# Patient Record
Sex: Female | Born: 1990 | Marital: Single | Smoking: Never smoker
Health system: Southern US, Community
[De-identification: ages and names within clinical notes are randomized; demographics above are authoritative.]

## PROBLEM LIST (undated history)

## (undated) DIAGNOSIS — T7840XA Allergy, unspecified, initial encounter: Secondary | ICD-10-CM

## (undated) DIAGNOSIS — F419 Anxiety disorder, unspecified: Secondary | ICD-10-CM

## (undated) HISTORY — DX: Anxiety disorder, unspecified: F41.9

## (undated) HISTORY — PX: ANTERIOR CRUCIATE LIGAMENT REPAIR: SHX115

## (undated) HISTORY — DX: Allergy, unspecified, initial encounter: T78.40XA

---

## 2011-08-31 ENCOUNTER — Other Ambulatory Visit: Payer: Self-pay | Admitting: Sports Medicine

## 2011-08-31 ENCOUNTER — Ambulatory Visit
Admission: RE | Admit: 2011-08-31 | Discharge: 2011-08-31 | Disposition: A | Discharge status: Home / Self Care | Payer: No Typology Code available for payment source | Source: Ambulatory Visit | Attending: Sports Medicine | Admitting: Sports Medicine

## 2011-08-31 DIAGNOSIS — M25561 Pain in right knee: Secondary | ICD-10-CM

## 2013-05-01 ENCOUNTER — Encounter (HOSPITAL_COMMUNITY): Payer: Self-pay | Admitting: Emergency Medicine

## 2013-05-01 ENCOUNTER — Emergency Department (HOSPITAL_COMMUNITY)
Admission: EM | Admit: 2013-05-01 | Discharge: 2013-05-01 | Disposition: A | Discharge status: Home / Self Care | Payer: Managed Care, Other (non HMO) | Attending: Emergency Medicine | Admitting: Emergency Medicine

## 2013-05-01 DIAGNOSIS — R590 Localized enlarged lymph nodes: Secondary | ICD-10-CM

## 2013-05-01 DIAGNOSIS — M549 Dorsalgia, unspecified: Secondary | ICD-10-CM | POA: Insufficient documentation

## 2013-05-01 DIAGNOSIS — M25569 Pain in unspecified knee: Secondary | ICD-10-CM | POA: Insufficient documentation

## 2013-05-01 DIAGNOSIS — R599 Enlarged lymph nodes, unspecified: Secondary | ICD-10-CM | POA: Insufficient documentation

## 2013-05-01 LAB — WET PREP, GENITAL
TRICH WET PREP: NONE SEEN
YEAST WET PREP: NONE SEEN

## 2013-05-01 MED ORDER — CEPHALEXIN 500 MG PO CAPS: 1000.0000 mg | ORAL_CAPSULE | Freq: Two times a day (BID) | ORAL | Status: DC

## 2013-05-01 NOTE — ED Provider Notes (Signed)
CSN: [id number]     Arrival date & time 05/01/13  1914 History  This chart was scribed for non-physician practitioner, Otilio Miuatherine E. Latrell Potempa, PA-C, working with Celene KrasJon R Knapp, MD, by Ellin MayhewMichael Levi, ED Scribe. This patient was seen in room WTR8/WTR8 and the patient's care was started at 8:23 PM.  The history is provided by the patient and a friend. No language interpreter was used.   HPI Comments: Misty Jackson is a 23 y.o. female who presents to the Emergency Department with a chief complaint of L groin swelling with onset 1 month. Patient reports she was seen at school by the PA and was informed she had a swollen lymph node. Patient was encouraged to take ibuprofen, and she has, with no relief. She reports being seen at the UC after seeing the PA at school, and having a UA, PAP smear, and pelvic exam for STDs with negative findings one month ago. Patient states that she has had constant, progressively worsening pain. She characterizes the pain as a mild ache which intermittently radiates as a tingling sensation to the back of the L calf and low back. She denies any trauma to the area, any other bumps, fevers, chills, nausea, vomiting, dysuria, vaginal discharge, abdominal pain, abnormal BMs. She denies any drainage from the area. She reports recently having a tattoo to the L buttocks approximately one month ago. She reports that she is sexually active with females and does not use protection. Patient is an otherwise healthy female. Patient states she is originally from [place] and does not have a PCP in [place].    Patient states she does not have an OB/GYN.   No past medical history on file. No past surgical history on file. No family history on file. History  Substance Use Topics  . Smoking status: Not on file  . Smokeless tobacco: Not on file  . Alcohol Use: Not on file   OB History   No data available     Review of Systems  Constitutional: Negative for fever, chills, activity change  and appetite change.  Gastrointestinal: Negative for nausea, vomiting, abdominal pain and diarrhea.  Genitourinary: Negative for dysuria, vaginal discharge, difficulty urinating and vaginal pain.  Musculoskeletal: Positive for arthralgias and back pain.  Neurological: Negative for numbness.  Hematological: Positive for adenopathy.  All other systems reviewed and are negative.  Allergies  Review of patient's allergies indicates not on file.  Home Medications   Prior to Admission medications   Not on File   Triage Vitals: BP 101/86  Pulse 67  Temp(Src) 98.7 F (37.1 C) (Oral)  Resp 17  SpO2 100%  Physical Exam  Nursing note and vitals reviewed. Constitutional: She is oriented to person, place, and time. She appears well-developed and well-nourished. No distress.  HENT:  Head: Normocephalic and atraumatic.  Eyes:  Normal appearance  Neck: Normal range of motion.  Pulmonary/Chest: Effort normal.  Genitourinary:  No genitalia rash.  No vaginal discharge/bleeding.  Cervix closed and appears nml.  No adnexal or cervical motion tenderess.  1-2 tender L inguinal lymph nodes.  Only one is mildly enlarged when compared to R side.  No overlying skin changes.  There is a large tattoo on L buttock w/out erythema, rash, tenderness.  No cervical, supraclavicular, axillary or R inguinal adenopathy.  Musculoskeletal: Normal range of motion.  Neurological: She is alert and oriented to person, place, and time.  Psychiatric: She has a normal mood and affect. Her behavior is normal.  ED Course  Procedures (including critical care time)  COORDINATION OF CARE: 8:33 PM-Discussed the possibility of an infectious process in the area. Recommended a pelvic exam to r/o any infection. Recommended patient to f/u with a gynecologist. Treatment plan discussed with patient and patient agrees.  Labs Review Labs Reviewed - No data to display  Imaging Review No results found.   EKG  Interpretation None      MDM   Final diagnoses:  Inguinal adenopathy    22yo healthy F presents w/ >1 month tender inguinal adenopathy.  No associated sx.   Large tattoo on L buttock that she got just prior to onset but it has been asymptomatic.  Has [facility] as well as an urgent care since onset, urine tested and neg for STDs and advised to take NSAID for pain.  On exam, 1-2 L inguinal lymph nodes mildly enlarged compared to R and ttp.  No other palpable lymph nodes.  Tattoo w/out erythema or other overlying skin changes.  Unremarkable genitalia.  Wet prep normal.  D/c'd home w/ 10d course of keflex and [facility].   I personally performed the services described in this documentation, which was scribed in my presence. The recorded information has been reviewed and is accurate.    Otilio MiuCatherine E Dickson Kostelnik, PA-C 05/01/13 2207

## 2013-05-01 NOTE — Discharge Instructions (Signed)
Your vaginal swab was normal.  The STD tests will result in 3 days and we will contact you if by chance they come back positive.  Take antibiotic as prescribed for possible bacterial infection.  Take tylenol or ibuprofen as needed for pain.  Follow [facility].   You may return to the ER if symptoms worsen or you have any other concerns.   Lymphadenopathy Lymphadenopathy means "disease of the lymph glands." But the term is usually used to describe swollen or enlarged lymph glands, also called lymph nodes. These are the bean-shaped organs found in many locations including the neck, underarm, and groin. Lymph glands are part of the immune system, which fights infections in your body. Lymphadenopathy can occur in just one area of the body, such as the neck, or it can be generalized, with lymph node enlargement in several areas. The nodes found in the neck are the most common sites of lymphadenopathy. CAUSES  When your immune system responds to germs (such as viruses or bacteria ), infection-fighting cells and fluid build up. This causes the glands to grow in size. This is usually not something to worry about. Sometimes, the glands themselves can become infected and inflamed. This is called lymphadenitis. Enlarged lymph nodes can be caused by many diseases:  Bacterial disease, such as strep throat or a skin infection.  Viral disease, such as a common cold.  Other germs, such as lyme disease, tuberculosis, or sexually transmitted diseases.  Cancers, such as lymphoma (cancer of the lymphatic system) or leukemia (cancer of the white blood cells).  Inflammatory diseases such as lupus or rheumatoid arthritis.  Reactions to medications. Many of the diseases above are rare, but important. This is why you should see your caregiver if you have lymphadenopathy. SYMPTOMS   Swollen, enlarged lumps in the neck, back of the head or other locations.  Tenderness.  Warmth or redness of the  skin over the lymph nodes.  Fever. DIAGNOSIS  Enlarged lymph nodes are often near the source of infection. They can help healthcare providers diagnose your illness. For instance:   Swollen lymph nodes around the jaw might be caused by an infection in the mouth.  Enlarged glands in the neck often signal a throat infection.  Lymph nodes that are swollen in more than one area often indicate an illness caused by a virus. Your caregiver most likely will know what is causing your lymphadenopathy after listening to your history and examining you. Blood tests, x-rays or other tests may be needed. If the cause of the enlarged lymph node cannot be found, and it does not go away by itself, then a biopsy may be needed. Your caregiver will discuss this with you. TREATMENT  Treatment for your enlarged lymph nodes will depend on the cause. Many times the nodes will shrink to normal size by themselves, with no treatment. Antibiotics or other medicines may be needed for infection. Only take over-the-counter or prescription medicines for pain, discomfort or fever as directed by your caregiver. HOME CARE INSTRUCTIONS  Swollen lymph glands usually return to normal when the underlying medical condition goes away. If they persist, contact your health-care provider. He/she might prescribe antibiotics or other treatments, depending on the diagnosis. Take any medications exactly as prescribed. Keep any follow-up appointments made to check on the condition of your enlarged nodes.  SEEK MEDICAL CARE IF:   Swelling lasts for more than two weeks.  You have symptoms such as weight loss, night sweats, fatigue  or fever that does not go away.  The lymph nodes are hard, seem fixed to the skin or are growing rapidly.  Skin over the lymph nodes is red and inflamed. This could mean there is an infection. SEEK IMMEDIATE MEDICAL CARE IF:   Fluid starts leaking from the area of the enlarged lymph node.  You develop a fever of  102 F (38.9 C) or greater.  Severe pain develops (not necessarily at the site of a large lymph node).  You develop chest pain or shortness of breath.  You develop worsening abdominal pain. MAKE SURE YOU:   Understand these instructions.  Will watch your condition.  Will get help right away if you are not doing well or get worse. Document Released: 09/29/2007 Document Revised: 03/14/2011 Document Reviewed: 09/29/2007 [facility] Patient Information 2014 [employer] [place].

## 2013-05-01 NOTE — ED Notes (Signed)
Bed: WA02 Expected date:  Expected time:  Means of arrival:  Comments: Tri 8

## 2013-05-01 NOTE — ED Notes (Signed)
Pt c/o L groin swelling. Pt went to UC and was told she has a swollen lymph node. Pt states it has been there for about a month. Pt denies any drainage from area.

## 2013-05-02 LAB — GC/CHLAMYDIA PROBE AMP
CT Probe RNA: NEGATIVE
GC Probe RNA: NEGATIVE

## 2013-05-03 NOTE — ED Provider Notes (Signed)
Medical screening examination/treatment/procedure(s) were performed by non-physician practitioner and as supervising physician I was immediately available for consultation/collaboration.    Aubryn Spinola R Theresea Trautmann, MD 05/03/13 1536 

## 2014-02-04 ENCOUNTER — Other Ambulatory Visit: Payer: Self-pay | Admitting: Cardiology

## 2014-02-04 ENCOUNTER — Ambulatory Visit
Admission: RE | Admit: 2014-02-04 | Discharge: 2014-02-04 | Disposition: A | Discharge status: Home / Self Care | Payer: No Typology Code available for payment source | Source: Ambulatory Visit | Attending: Cardiology | Admitting: Cardiology

## 2014-02-04 DIAGNOSIS — R0602 Shortness of breath: Secondary | ICD-10-CM

## 2016-08-12 IMAGING — CR DG CHEST 2V
2 series · 2 of 2 positions shown · non-contrast
Comparison: None.

CLINICAL DATA: Intermittent Cardiac palpitations, shortness of
breath, upper chest pain, and dry cough.

EXAM:
CHEST  2 VIEW

[w chest pa]
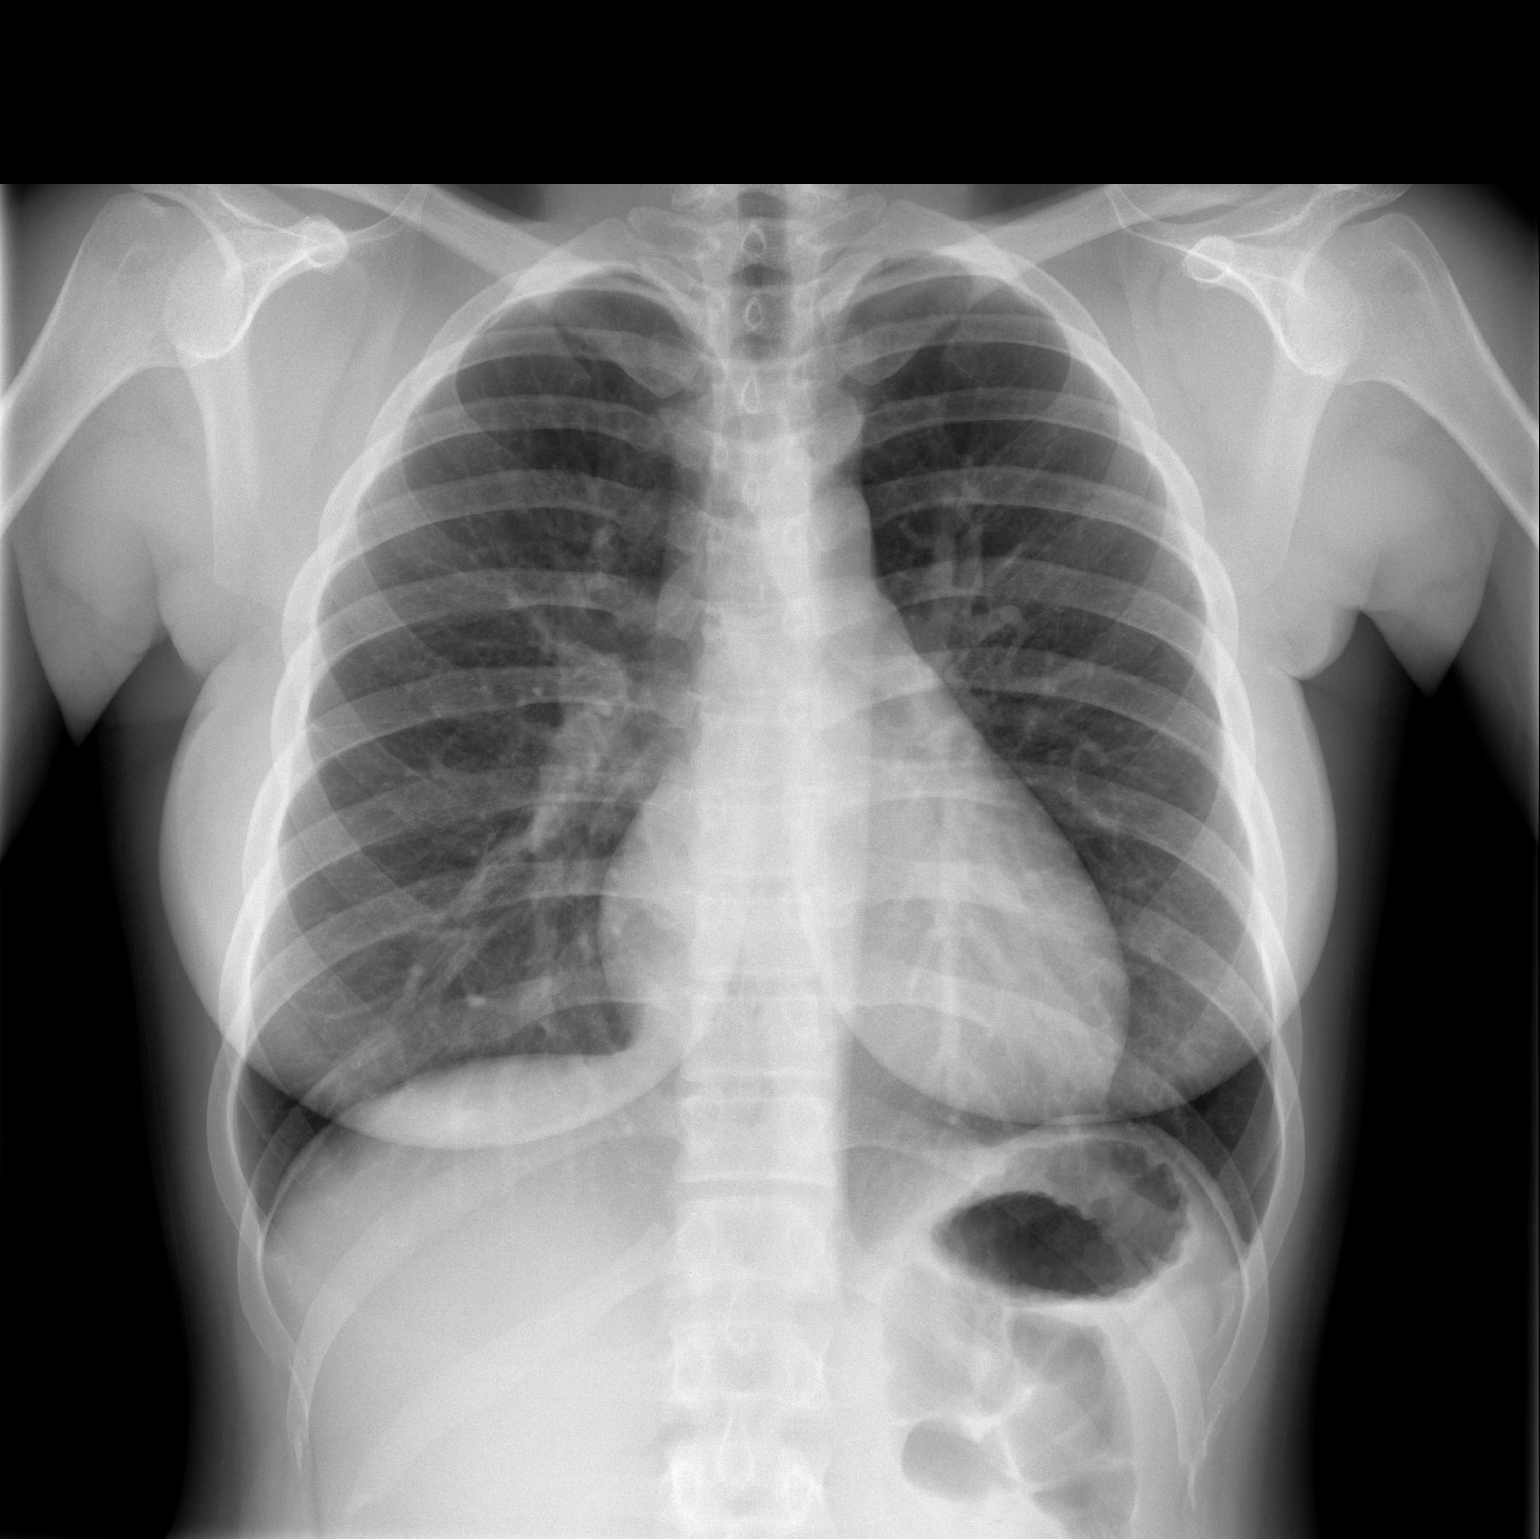

[w chest lat]
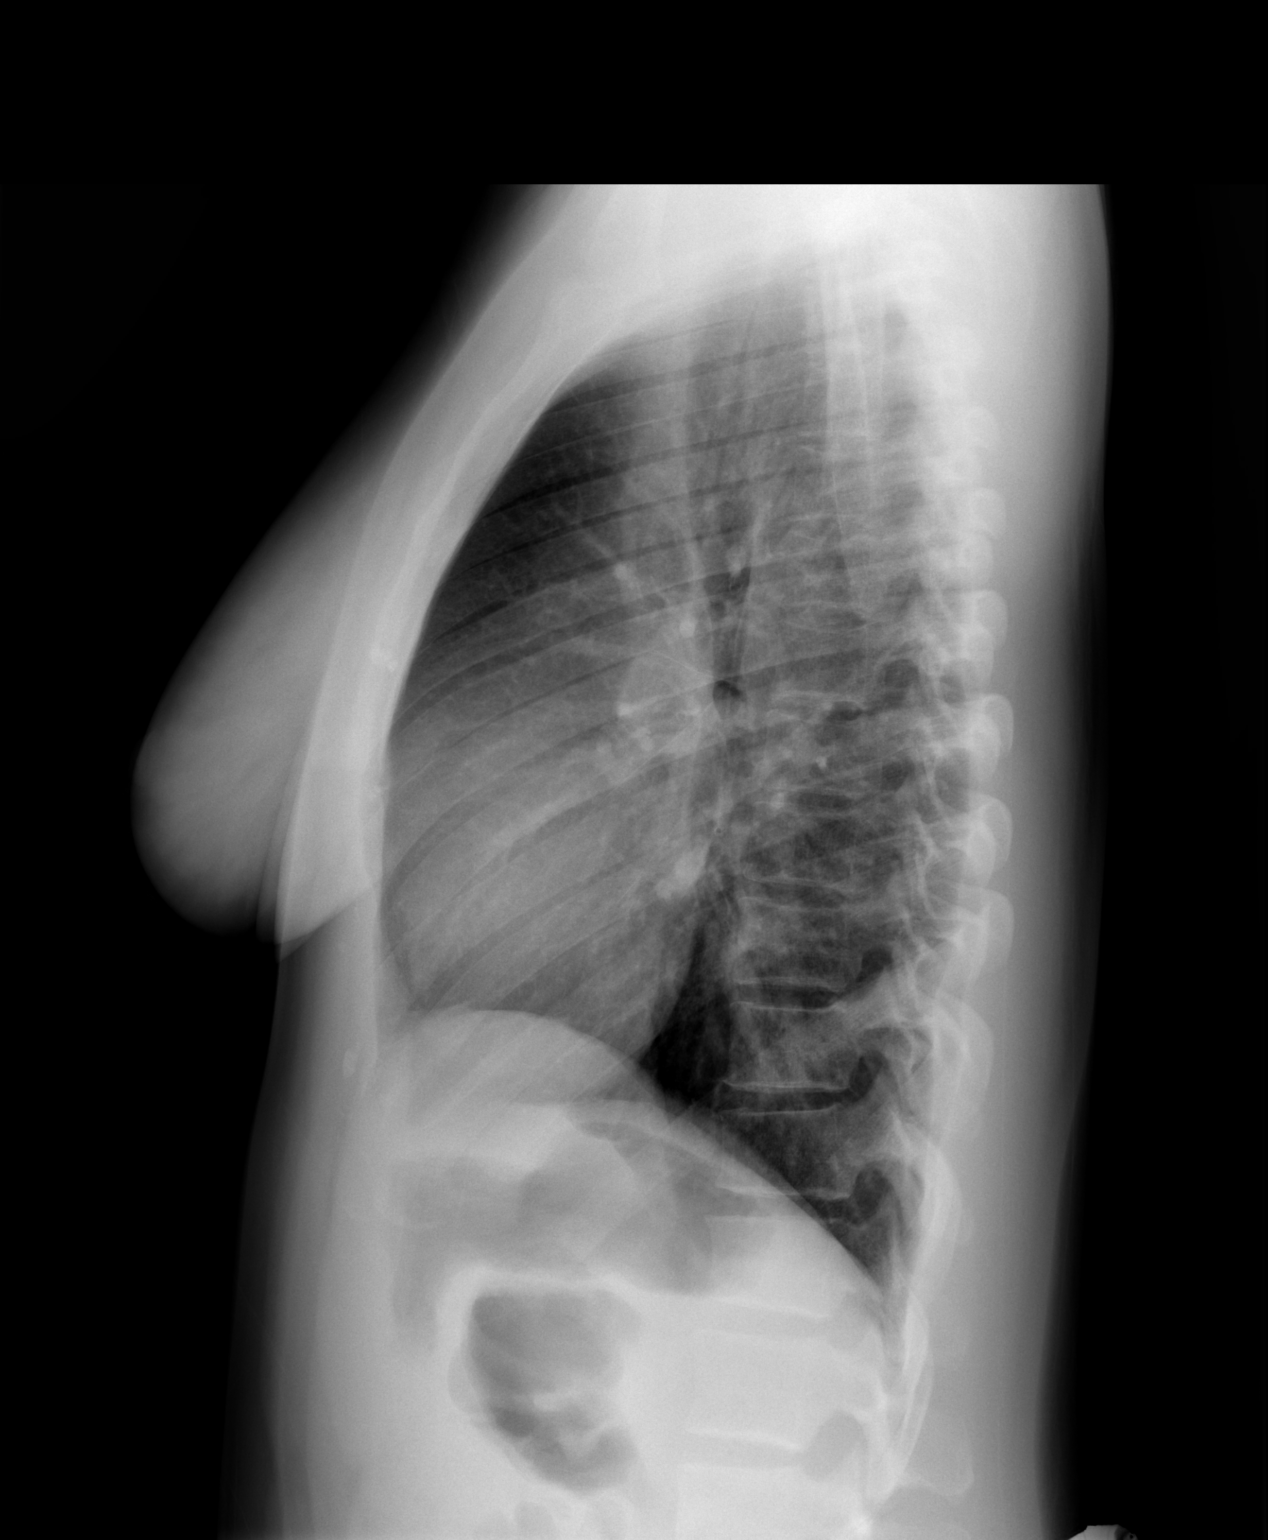

[2 of 2 positions shown; findings below may reference images not displayed]

FINDINGS: Borderline enlargement of the cardiopericardial silhouette the lungs
appear clear. No retrosternal density or pleural effusion.
IMPRESSION: 1. Borderline enlargement of the cardiopericardial silhouette.
Otherwise negative.

## 2016-11-10 ENCOUNTER — Encounter: Payer: Self-pay | Admitting: Family Medicine

## 2016-11-10 ENCOUNTER — Ambulatory Visit: Payer: BC Managed Care – PPO | Admitting: Family Medicine

## 2016-11-10 ENCOUNTER — Other Ambulatory Visit: Payer: Self-pay

## 2016-11-10 VITALS — BP 108/70 | HR 69 | Temp 98.1°F | Resp 18 | Ht 65.2 in | Wt 165.6 lb

## 2016-11-10 DIAGNOSIS — N760 Acute vaginitis: Secondary | ICD-10-CM | POA: Diagnosis not present

## 2016-11-10 DIAGNOSIS — R197 Diarrhea, unspecified: Secondary | ICD-10-CM

## 2016-11-10 DIAGNOSIS — R1030 Lower abdominal pain, unspecified: Secondary | ICD-10-CM | POA: Diagnosis not present

## 2016-11-10 DIAGNOSIS — B9689 Other specified bacterial agents as the cause of diseases classified elsewhere: Secondary | ICD-10-CM | POA: Diagnosis not present

## 2016-11-10 DIAGNOSIS — Z124 Encounter for screening for malignant neoplasm of cervix: Secondary | ICD-10-CM | POA: Diagnosis not present

## 2016-11-10 LAB — POCT URINALYSIS DIP (MANUAL ENTRY)
Bilirubin, UA: NEGATIVE
Blood, UA: NEGATIVE
Glucose, UA: NEGATIVE mg/dL
Ketones, POC UA: NEGATIVE mg/dL
Leukocytes, UA: NEGATIVE
Nitrite, UA: NEGATIVE
Protein Ur, POC: NEGATIVE mg/dL
Spec Grav, UA: 1.02 (ref 1.010–1.025)
Urobilinogen, UA: 0.2 E.U./dL
pH, UA: 7 (ref 5.0–8.0)

## 2016-11-10 LAB — POCT WET + KOH PREP
Trich by wet prep: ABSENT
Yeast by KOH: ABSENT
Yeast by wet prep: ABSENT

## 2016-11-10 MED ORDER — METRONIDAZOLE 500 MG PO TABS: 500.0000 mg | ORAL_TABLET | Freq: Two times a day (BID) | ORAL | 0 refills | Status: DC

## 2016-11-10 NOTE — Progress Notes (Signed)
104 1 

## 2016-11-10 NOTE — Progress Notes (Addendum)
11/8/20188:45 AM  Misty DiceAshley Scheidegger 1990/06/14, 26 y.o. female [id number]  Chief Complaint  Patient presents with  . Abdominal Pain    X 2-3 mth off and on abdominal cramps  . Pap Smear    HPI:   Patient is a 26 y.o. female who presents today for 2-3 months of intermittent abdominal cramping with bloating. She thinks it is happening about 2 weeks before her period for 2-3 days. However she has also noticed diarrhea for the past 2 weeks. Last week her bad pain woke her up from sleep. She took ibuprofen and was able to go back to sleep. She denies any nausea, vomiting, constipation, fever, chills, decreased appetite, malaise, dysuria, hematuria, abnormal vaginal discharge. 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  G0 LMP 10/14/16 Menses are regular of normal flow Sexually active, prefers women, last encounter with men was in June, used condoms OCPs in high school, did not like side effects of bloating and weight gain No recent STD testing Reports due for pap, denies h/o abnormal  Depression screen PHQ 2/9 11/10/2016  Decreased Interest 0  Down, Depressed, Hopeless 0  PHQ - 2 Score 0    No Known Allergies  Prior to Admission medications   Medication Sig Start Date End Date Taking? Authorizing Provider  acetaminophen (TYLENOL) 325 MG tablet Take 650 mg by mouth every 6 (six) hours as needed (pain).   Yes [provider]  Multiple Vitamins-Minerals (HAIR/SKIN/NAILS) TABS Take 1 tablet by mouth daily.   Yes [provider]  cephALEXin (KEFLEX) 500 MG capsule Take 2 capsules (1,000 mg total) by mouth 2 (two) times daily. Patient not taking: Reported on 11/10/2016 05/01/13   Schinlever, Santina Evansatherine, PA-C  cetirizine (ZYRTEC) 10 MG tablet Take 10 mg by mouth daily.    [provider]    Past Medical History:  Diagnosis Date  . Allergy   . Anxiety     Past Surgical History:  Procedure Laterality Date  . ANTERIOR CRUCIATE LIGAMENT  REPAIR Right    2010 and 2013    Social History   Tobacco Use  . Smoking status: Never Smoker  . Smokeless tobacco: Never Used  Substance Use Topics  . Alcohol use: Yes    Comment: occ    Family History  Problem Relation Age of Onset  . Cancer Mother   . Stroke Maternal Grandmother   . Stroke Paternal Grandmother   . Stroke Paternal Grandfather     ROS Per hpi  OBJECTIVE:  Blood pressure 108/70, pulse 69, temperature 98.1 F (36.7 C), temperature source Oral, resp. rate 18, height 5' 5.2" (1.656 m), weight 165 lb 9.6 oz (75.1 kg), last menstrual period 10/14/2016, SpO2 99 %.  Physical Exam  Constitutional: She is oriented to person, place, and time and well-developed, well-nourished, and in no distress.  HENT:  Head: Normocephalic and atraumatic.  Mouth/Throat: Oropharynx is clear and moist. No oropharyngeal exudate.  Eyes: EOM are normal. Pupils are equal, round, and reactive to light. No scleral icterus.  Neck: Neck supple.  Cardiovascular: Normal rate, regular rhythm and normal heart sounds. Exam reveals no gallop and no friction rub.  No murmur heard. Pulmonary/Chest: Effort normal and breath sounds normal. She has no wheezes. She has no rales.  Abdominal: Soft. Bowel sounds are normal. There is tenderness in the suprapubic area, left upper quadrant and left lower quadrant. There is no rebound and no guarding.  Genitourinary: Uterus is not enlarged and  not tender.  Cervix is not fixed. Cervix exhibits no motion tenderness, no lesion and no tenderness. Right adnexum displays no mass and no tenderness. Left adnexum displays no mass and no tenderness. Vulva exhibits no lesion and no rash. Vagina exhibits rugosity. Vagina exhibits no lesion. Thin  white and vaginal discharge found.  Musculoskeletal: She exhibits no edema.  Neurological: She is alert and oriented to person, place, and time. Gait normal.  Skin: Skin is warm and dry.      Results for orders placed or  performed in visit on 11/10/16 (from the past 24 hour(s))  POCT Wet + KOH Prep     Status: Abnormal   Collection Time: 11/10/16  9:05 AM  Result Value Ref Range   Yeast by KOH Absent Absent   Yeast by wet prep Absent Absent   WBC by wet prep None (A) Few   Clue Cells Wet Prep HPF POC Moderate (A) None   Trich by wet prep Absent Absent   Bacteria Wet Prep HPF POC Many (A) Few   Epithelial Cells By [employer] Financial[employer]et Pref ([facility]) Few None, Few, Too numerous to count   RBC,UR,HPF,POC None None RBC/hpf    No results found.   ASSESSMENT and PLAN  1. Encounter for screening for cervical cancer  - Pap IG, CT/NG w/ reflex HPV when ASC-U  2. Lower abdominal pain Might be related to Bacterial vaginosis, abx prescribed, r/se/b discussed. Also discussed it might be ovulation related given pattern. - POCT Wet + KOH Prep - POCT urinalysis dipstick  3. Diarrhea, unspecified type - GI Profile, Stool, PCR  4. Bacterial vaginosis - metroNIDAZOLE (FLAGYL) 500 MG tablet; Take 1 tablet (500 mg total) 2 (two) times daily by mouth.   Return if symptoms worsen or fail to improve.    Myles LippsIrma M Santiago, MD [facility] at [facility] [address] [place], [place] [address] Ph.  [phone] Fax [phone]

## 2016-11-10 NOTE — Patient Instructions (Signed)
     IF you received an x-ray today, you will receive an invoice from [facility]. Please contact [facility] at [phone] with questions or concerns regarding your invoice.   IF you received labwork today, you will receive an invoice from [facility]. Please contact [facility] at [phone] with questions or concerns regarding your invoice.   Our billing staff will not be able to assist you with questions regarding bills from these companies.  You will be contacted with the lab results as soon as they are available. The fastest way to get your results is to activate your My Chart account. Instructions are located on the last page of this paperwork. If you have not heard from us regarding the results in 2 weeks, please contact this office.     

## 2016-11-14 LAB — PAP IG, CT-NG, RFX HPV ASCU
Chlamydia, Nuc. Acid Amp: NEGATIVE
Gonococcus by Nucleic Acid Amp: NEGATIVE
PAP Smear Comment: 0

## 2016-11-15 ENCOUNTER — Encounter: Payer: Self-pay | Admitting: Family Medicine

## 2017-12-19 ENCOUNTER — Other Ambulatory Visit: Payer: Self-pay

## 2017-12-19 ENCOUNTER — Ambulatory Visit: Payer: BC Managed Care – PPO | Admitting: Family Medicine

## 2017-12-19 ENCOUNTER — Encounter: Payer: Self-pay | Admitting: Family Medicine

## 2017-12-19 VITALS — BP 106/69 | HR 60 | Temp 98.6°F | Ht 65.2 in | Wt 161.2 lb

## 2017-12-19 DIAGNOSIS — Z0000 Encounter for general adult medical examination without abnormal findings: Secondary | ICD-10-CM

## 2017-12-19 DIAGNOSIS — F418 Other specified anxiety disorders: Secondary | ICD-10-CM

## 2017-12-19 DIAGNOSIS — Z1321 Encounter for screening for nutritional disorder: Secondary | ICD-10-CM

## 2017-12-19 DIAGNOSIS — Z13220 Encounter for screening for lipoid disorders: Secondary | ICD-10-CM

## 2017-12-19 DIAGNOSIS — Z113 Encounter for screening for infections with a predominantly sexual mode of transmission: Secondary | ICD-10-CM

## 2017-12-19 DIAGNOSIS — Z13228 Encounter for screening for other metabolic disorders: Secondary | ICD-10-CM

## 2017-12-19 DIAGNOSIS — Z0001 Encounter for general adult medical examination with abnormal findings: Secondary | ICD-10-CM | POA: Diagnosis not present

## 2017-12-19 DIAGNOSIS — Z1329 Encounter for screening for other suspected endocrine disorder: Secondary | ICD-10-CM

## 2017-12-19 DIAGNOSIS — Z130 Encounter for screening for diseases of the blood and blood-forming organs and certain disorders involving the immune mechanism: Secondary | ICD-10-CM

## 2017-12-19 MED ORDER — DULOXETINE HCL 30 MG PO CPEP: 30.0000 mg | ORAL_CAPSULE | Freq: Every day | ORAL | 3 refills | Status: DC

## 2017-12-19 MED ORDER — MONTELUKAST SODIUM 10 MG PO TABS: 10.0000 mg | ORAL_TABLET | Freq: Every day | ORAL | 3 refills | Status: DC

## 2017-12-19 NOTE — Progress Notes (Signed)
12/17/20193:54 PM  Misty Jackson 1990/04/18, 27 y.o. female [id number]  Chief Complaint  Patient presents with  . Annual Exam    needs allergy meds, using otc meds    HPI:   Patient is a 27 y.o. female with past medical history significant for seasonal allergies who presents today for CPE  Last CPE a year ago Last pap in 2018, normal Last STD testing a year ago GO Menses are regular Uses condoms Recently no sex drive  Broke her left foot metatarsal, currently in boot and scooter Was working out 2-3 x day Does not restrict eating Does endorse fear of "getting fat"  Having issues with allergies  Has tried zyrtec and allegra Does not want to use nasal spray  Having increased depression, worse before periods Has been on medication for anxiety before, and did not like them Last medications were about 3 years ago Has seen therapist before  Thinks she has been on lexapro and zoloft - may have been too stimulating ? buspar Having mood changes, can wake up fine and suddenly has change to dark mood, brain constantly moving, fleeting thoughts, some worrying Has occasional insomnia    Fall Risk  12/19/2017 11/10/2016  Falls in the past year? 1 No  Number falls in past yr: 0 -  Injury with Fall? 1 -     Depression screen [facility] 2/9 12/19/2017 12/19/2017 11/10/2016  Decreased Interest 3 0 0  Down, Depressed, Hopeless 2 0 0  PHQ - 2 Score 5 0 0  Altered sleeping 1 - -  Tired, decreased energy 1 - -  Change in appetite 2 - -  Feeling bad or failure about yourself  2 - -  Trouble concentrating 1 - -  Moving slowly or fidgety/restless 0 - -  Suicidal thoughts 1 - -  PHQ-9 Score 13 - -  Difficult doing work/chores Somewhat difficult - -   Reports passive SI, denies any thoughts of hurting herself  GAD 7 : Generalized Anxiety Score 12/19/2017  Nervous, Anxious, on Edge 2  Control/stop worrying 3  Worry too much - different things 1  Trouble relaxing 1  Restless 0    Easily annoyed or irritable 2  Afraid - awful might happen 2  Total GAD 7 Score 11  Anxiety Difficulty Somewhat difficult   MDQ9 = Q1. 8 positive, Q2 no, Q3, minor problem = negative screen  No Known Allergies  Prior to Admission medications   Medication Sig Start Date End Date Taking? Authorizing Provider  Multiple Vitamin (MULTIVITAMIN WITH MINERALS) TABS tablet Take 1 tablet by mouth daily.   Yes [provider]  Multiple Vitamins-Minerals (HAIR/SKIN/NAILS) TABS Take 1 tablet by mouth daily.   Yes [provider]    Past Medical History:  Diagnosis Date  . Allergy   . Anxiety     Past Surgical History:  Procedure Laterality Date  . ANTERIOR CRUCIATE LIGAMENT REPAIR Right    2010 and 2013    Social History   Tobacco Use  . Smoking status: Never Smoker  . Smokeless tobacco: Never Used  Substance Use Topics  . Alcohol use: Yes    Comment: occ    Family History  Problem Relation Age of Onset  . Cancer Mother   . Stroke Maternal Grandmother   . Stroke Paternal Grandmother   . Stroke Paternal Grandfather     Review of Systems  Constitutional: Negative for chills and fever.  Respiratory: Negative for cough and shortness of breath.  Cardiovascular: Negative for chest pain, palpitations and leg swelling.  Gastrointestinal: Negative for abdominal pain, nausea and vomiting.  All other systems reviewed and are negative. per hpi   OBJECTIVE:  Blood pressure 106/69, pulse 60, temperature 98.6 F (37 C), temperature source Oral, height 5' 5.2" (1.656 m), weight 161 lb 3.2 oz (73.1 kg), SpO2 98 %. Body mass index is 26.66 kg/m.   Physical Exam Vitals signs and nursing note reviewed.  Constitutional:      Appearance: She is well-developed.  HENT:     Head: Normocephalic and atraumatic.  Eyes:     General: No scleral icterus.    Conjunctiva/sclera: Conjunctivae normal.     Pupils: Pupils are equal, round, and reactive to light.  Neck:      Musculoskeletal: Neck supple.  Pulmonary:     Effort: Pulmonary effort is normal.  Skin:    General: Skin is warm and dry.  Neurological:     Mental Status: She is alert and oriented to person, place, and time.    ASSESSMENT and PLAN  1. Annual physical exam Routine HCM labs ordered. HCM reviewed/discussed. Anticipatory guidance regarding healthy weight, lifestyle and choices given.   2. Depression with anxiety New diagnosis. Uncontrolled. Discussed treatment options. Given previous experience with SSRI, will do trial of SNRI. Resume counseling, provided contact info, favoring three birds given concerning behaviors regarding exercise. New med r/se/b reviewed  3. Screening for endocrine, nutritional, metabolic and immunity disorder - CMP14+EGFR  4. Screening for thyroid disorder - TSH  5. Screening for lipid disorders - Lipid panel  6. Screening examination for sexually transmitted disease - HIV Antibody (routine testing w rflx) - RPR - GC/Chlamydia Probe Amp([facility]) - Hepatitis C Antibody  Seasonal allergies - adding singulair. Reviewed r/se/b  Other orders - Multiple Vitamin (MULTIVITAMIN WITH MINERALS) TABS tablet; Take 1 tablet by mouth daily. - DULoxetine (CYMBALTA) 30 MG capsule; Take 1 capsule (30 mg total) by mouth daily. - montelukast (SINGULAIR) 10 MG tablet; Take 1 tablet (10 mg total) by mouth at bedtime.   Return in about 4 weeks (around 01/16/2018) for depression and anxiety.    Rutherford Guys, MD Primary Care at [place], [place] [address] Ph.  [phone] Fax [phone]

## 2017-12-19 NOTE — Patient Instructions (Addendum)
.  For therapy -- [facility] ([address]Beth Coralie CommonKincaid, Ernest McCoy, Heather LeonardKitchens) [place]- [phone] [facility] [address](Julie Jerilynn MagesWhitt, Terri [place]) - [phone] [facility] - [phone] [facility] - [phone] Buena IrishBob Mylan - [phone] Jeanella FlatterySarah Young - [facility], [phone] [facility]  - [phone] (do not file insurance) Paula ComptonKarla [phone] Doyne KeelKaren Elliot - [phone] Jeanella FlatterySarah Young - [facility], [phone] [facility] - [phone]   If you have lab work done today you will be contacted with your lab results within the next 2 weeks.  If you have not heard from us then please contact us. The fastest way to get your results is to register for My Chart.   IF you received an x-ray today, you will receive an invoice from [facility]. Please contact [facility] at [phone] with questions or concerns regarding your invoice.   IF you received labwork today, you will receive an invoice from [facility]. Please contact [facility] at [phone] with questions or concerns regarding your invoice.   Our billing staff will not be able to assist you with questions regarding bills from these companies.  You will be contacted with the lab results as soon as they are available. The fastest way to get your results is to activate your My Chart account. Instructions are located on the last page of this paperwork. If you have not heard from us regarding the results in 2 weeks, please contact this office.

## 2017-12-20 LAB — CMP14+EGFR
ALT: 22 IU/L (ref 0–32)
AST: 21 IU/L (ref 0–40)
Albumin/Globulin Ratio: 2 (ref 1.2–2.2)
Albumin: 4.6 g/dL (ref 3.5–5.5)
Alkaline Phosphatase: 49 IU/L (ref 39–117)
BUN/Creatinine Ratio: 17 (ref 9–23)
BUN: 15 mg/dL (ref 6–20)
Bilirubin Total: 1.1 mg/dL (ref 0.0–1.2)
CO2: 21 mmol/L (ref 20–29)
Calcium: 9.5 mg/dL (ref 8.7–10.2)
Chloride: 104 mmol/L (ref 96–106)
Creatinine, Ser: 0.86 mg/dL (ref 0.57–1.00)
GFR calc Af Amer: 107 mL/min/{1.73_m2} (ref >59)
GFR calc non Af Amer: 93 mL/min/{1.73_m2} (ref >59)
Globulin, Total: 2.3 g/dL (ref 1.5–4.5)
Glucose: 80 mg/dL (ref 65–99)
Potassium: 4 mmol/L (ref 3.5–5.2)
Sodium: 141 mmol/L (ref 134–144)
Total Protein: 6.9 g/dL (ref 6.0–8.5)

## 2017-12-20 LAB — RPR: RPR Ser Ql: NONREACTIVE

## 2017-12-20 LAB — HEPATITIS C ANTIBODY: Hep C Virus Ab: <0.1 s/co ratio (ref 0.0–0.9)

## 2017-12-20 LAB — LIPID PANEL
Chol/HDL Ratio: 3.1 ratio (ref 0.0–4.4)
Cholesterol, Total: 169 mg/dL (ref 100–199)
HDL: 54 mg/dL (ref >39)
LDL Calculated: 97 mg/dL (ref 0–99)
Triglycerides: 90 mg/dL (ref 0–149)
VLDL Cholesterol Cal: 18 mg/dL (ref 5–40)

## 2017-12-20 LAB — TSH: TSH: 0.953 u[IU]/mL (ref 0.450–4.500)

## 2017-12-20 LAB — HIV ANTIBODY (ROUTINE TESTING W REFLEX): HIV Screen 4th Generation wRfx: NONREACTIVE

## 2017-12-21 LAB — GC/CHLAMYDIA PROBE AMP
Chlamydia trachomatis, NAA: NEGATIVE
Neisseria gonorrhoeae by PCR: NEGATIVE

## 2018-01-19 ENCOUNTER — Ambulatory Visit: Payer: BC Managed Care – PPO | Admitting: Family Medicine

## 2018-02-28 ENCOUNTER — Ambulatory Visit: Payer: BC Managed Care – PPO | Admitting: Family Medicine

## 2018-02-28 ENCOUNTER — Encounter: Payer: Self-pay | Admitting: Family Medicine

## 2018-02-28 ENCOUNTER — Other Ambulatory Visit: Payer: Self-pay

## 2018-02-28 VITALS — BP 109/72 | HR 66 | Temp 98.8°F | Ht 65.2 in | Wt 162.6 lb

## 2018-02-28 DIAGNOSIS — J302 Other seasonal allergic rhinitis: Secondary | ICD-10-CM | POA: Diagnosis not present

## 2018-02-28 DIAGNOSIS — F418 Other specified anxiety disorders: Secondary | ICD-10-CM | POA: Insufficient documentation

## 2018-02-28 DIAGNOSIS — R4689 Other symptoms and signs involving appearance and behavior: Secondary | ICD-10-CM | POA: Diagnosis not present

## 2018-02-28 MED ORDER — DULOXETINE HCL 60 MG PO CPEP: 60.0000 mg | ORAL_CAPSULE | Freq: Every day | ORAL | 2 refills | Status: DC

## 2018-02-28 MED ORDER — MONTELUKAST SODIUM 10 MG PO TABS: 10.0000 mg | ORAL_TABLET | Freq: Every day | ORAL | 3 refills | Status: DC

## 2018-02-28 NOTE — Patient Instructions (Signed)
° ° ° °  If you have lab work done today you will be contacted with your lab results within the next 2 weeks.  If you have not heard from us then please contact us. The fastest way to get your results is to register for My Chart. ° ° °IF you received an x-ray today, you will receive an invoice from [facility]. Please contact [facility] at [phone] with questions or concerns regarding your invoice.  ° °IF you received labwork today, you will receive an invoice from [facility]. Please contact [facility] at [phone] with questions or concerns regarding your invoice.  ° °Our billing staff will not be able to assist you with questions regarding bills from these companies. ° °You will be contacted with the lab results as soon as they are available. The fastest way to get your results is to activate your My Chart account. Instructions are located on the last page of this paperwork. If you have not heard from us regarding the results in 2 weeks, please contact this office. °  ° ° ° °

## 2018-02-28 NOTE — Progress Notes (Signed)
2/26/20204:52 PM  Misty Jackson 11/15/1990, 28 y.o. female [id number]  Chief Complaint  Patient presents with  . Depression    Medications doing well, just giving her weird dreams and causing constipation  . Anxiety  . Results    would like to discuss results from Dec labs    HPI:   Patient is a 28 y.o. female with past medical history significant for depression, anxiety, seasonal allergies, who presents today for followup after starting cymbalta  "best medication I have been on" Mood improved, but still having occ sadness Still having anxiety with compulsive behavior: checks for lock doors at school and home, checks stove, getting up 3-4 times a night to check on something Dreams since she started cymbalta, weird but not disturbing Having some constipation, she has used miralax before and done better Doing really well on singulair - happy   Fall Risk  02/28/2018 12/19/2017 11/10/2016  Falls in the past year? 0 1 No  Number falls in past yr: 0 0 -  Injury with Fall? 0 1 -    GAD 7 : Generalized Anxiety Score 02/28/2018 12/19/2017  Nervous, Anxious, on Edge 1 2  Control/stop worrying 2 3  Worry too much - different things 2 1  Trouble relaxing 1 1  Restless 0 0  Easily annoyed or irritable 1 2  Afraid - awful might happen 2 2  Total GAD 7 Score 9 11  Anxiety Difficulty Not difficult at all Somewhat difficult     Depression screen [facility] 2/9 02/28/2018 02/28/2018 12/19/2017  Decreased Interest 1 0 3  Down, Depressed, Hopeless 1 0 2  PHQ - 2 Score 2 0 5  Altered sleeping 2 - 1  Tired, decreased energy 2 - 1  Change in appetite 2 - 2  Feeling bad or failure about yourself  0 - 2  Trouble concentrating 0 - 1  Moving slowly or fidgety/restless 0 - 0  Suicidal thoughts 0 - 1  PHQ-9 Score 8 - 13  Difficult doing work/chores Somewhat difficult - Somewhat difficult    No Known Allergies  Prior to Admission medications   Medication Sig Start Date End Date Taking?  Authorizing Provider  DULoxetine (CYMBALTA) 30 MG capsule Take 1 capsule (30 mg total) by mouth daily. 12/19/17  Yes Myles Lipps, MD  montelukast (SINGULAIR) 10 MG tablet Take 1 tablet (10 mg total) by mouth at bedtime. 12/19/17  Yes Myles Lipps, MD  Multiple Vitamin (MULTIVITAMIN WITH MINERALS) TABS tablet Take 1 tablet by mouth daily.   Yes [provider]  Multiple Vitamins-Minerals (HAIR/SKIN/NAILS) TABS Take 1 tablet by mouth daily.   Yes [provider]    Past Medical History:  Diagnosis Date  . Allergy   . Anxiety     Past Surgical History:  Procedure Laterality Date  . ANTERIOR CRUCIATE LIGAMENT REPAIR Right    2010 and 2013    Social History   Tobacco Use  . Smoking status: Never Smoker  . Smokeless tobacco: Never Used  Substance Use Topics  . Alcohol use: Yes    Comment: occ    Family History  Problem Relation Age of Onset  . Cancer Mother   . Stroke Maternal Grandmother   . Stroke Paternal Grandmother   . Stroke Paternal Grandfather     ROS Per hpi  OBJECTIVE:  Blood pressure 109/72, pulse 66, temperature 98.8 F (37.1 C), temperature source Oral, height 5' 5.2" (1.656 m), weight 162 lb 9.6 oz (73.8 kg),  SpO2 100 %. Body mass index is 26.89 kg/m.   Physical Exam Vitals signs and nursing note reviewed.  Constitutional:      Appearance: She is well-developed.  HENT:     Head: Normocephalic and atraumatic.  Eyes:     General: No scleral icterus.    Conjunctiva/sclera: Conjunctivae normal.     Pupils: Pupils are equal, round, and reactive to light.  Neck:     Musculoskeletal: Neck supple.  Pulmonary:     Effort: Pulmonary effort is normal.  Skin:    General: Skin is warm and dry.  Neurological:     Mental Status: She is alert and oriented to person, place, and time.    Labs reviewed with patient  ASSESSMENT and PLAN  1. Depression with anxiety 3. Compulsive behaviors Improved. Having some mild side effects.  Discussed mgt of constipation. Dreams not disturbing. Expressing compulsive behavior, is there a component of OCD? Reassess at next visit. Consider psych referral  2. Seasonal allergies Controlled. Continue current regime.    Other orders - DULoxetine (CYMBALTA) 60 MG capsule; Take 1 capsule (60 mg total) by mouth daily. - montelukast (SINGULAIR) 10 MG tablet; Take 1 tablet (10 mg total) by mouth at bedtime.  Return in about 4 weeks (around 03/28/2018).    Myles Lipps, MD Primary Care at [facility] [address] [place], [place] [address] Ph.  [phone] Fax [phone]

## 2018-03-28 ENCOUNTER — Telehealth (INDEPENDENT_AMBULATORY_CARE_PROVIDER_SITE_OTHER): Payer: BC Managed Care – PPO | Admitting: Family Medicine

## 2018-03-28 ENCOUNTER — Ambulatory Visit: Payer: BC Managed Care – PPO | Admitting: Family Medicine

## 2018-03-28 ENCOUNTER — Other Ambulatory Visit: Payer: Self-pay

## 2018-03-28 DIAGNOSIS — F418 Other specified anxiety disorders: Secondary | ICD-10-CM | POA: Diagnosis not present

## 2018-03-28 DIAGNOSIS — K5903 Drug induced constipation: Secondary | ICD-10-CM | POA: Diagnosis not present

## 2018-03-28 DIAGNOSIS — J302 Other seasonal allergic rhinitis: Secondary | ICD-10-CM | POA: Diagnosis not present

## 2018-03-28 MED ORDER — DULOXETINE HCL 60 MG PO CPEP: 60.0000 mg | ORAL_CAPSULE | Freq: Every day | ORAL | 1 refills | Status: DC

## 2018-03-28 MED ORDER — FLUTICASONE PROPIONATE 50 MCG/ACT NA SUSP: 1.0000 | Freq: Two times a day (BID) | NASAL | 6 refills | Status: AC

## 2018-03-28 MED ORDER — MONTELUKAST SODIUM 10 MG PO TABS: 10.0000 mg | ORAL_TABLET | Freq: Every day | ORAL | 3 refills | Status: DC

## 2018-03-28 NOTE — Progress Notes (Signed)
Virtual Visit via telephone Note  I connected with patient on 03/28/18 at 1016 by telephone and verified that I am speaking with the correct person using two identifiers. Misty Jackson is currently located at car and patient is currently with her during visit. The provider, Myles Lipps, MD is located in their home at time of visit.  I discussed the limitations, risks, security and privacy concerns of performing an evaluation and management service by telephone and the availability of in person appointments. I also discussed with the patient that there may be a patient responsible charge related to this service. The patient expressed understanding and agreed to proceed.  CC: change in medicine  Telephone visit today for followup on cymbalta  HPI Last OV feb 2020, increased cymbalta from 30mg  to 60mg  Mood is doing well OCD tendencies are improved, not checking doors, knobs or house walk thru much at all, maybe once or twice instead of over and over  Still having some interesting dreams and constipation - tolerable, working thru them miralax once a day provides partial relief  singulair providing partial relief, still having nasal congestion?  Fall Risk  03/28/2018 02/28/2018 12/19/2017 11/10/2016  Falls in the past year? 0 0 1 No  Number falls in past yr: 0 0 0 -  Injury with Fall? 0 0 1 -  Follow up Falls evaluation completed - - -     Depression screen [facility] 2/9 03/28/2018 02/28/2018 02/28/2018  Decreased Interest 0 1 0  Down, Depressed, Hopeless 0 1 0  PHQ - 2 Score 0 2 0  Altered sleeping - 2 -  Tired, decreased energy - 2 -  Change in appetite - 2 -  Feeling bad or failure about yourself  - 0 -  Trouble concentrating - 0 -  Moving slowly or fidgety/restless - 0 -  Suicidal thoughts - 0 -  PHQ-9 Score - 8 -  Difficult doing work/chores - Somewhat difficult -    No Known Allergies  Prior to Admission medications   Medication Sig Start Date End Date Taking? Authorizing  Provider  DULoxetine (CYMBALTA) 60 MG capsule Take 1 capsule (60 mg total) by mouth daily. 02/28/18  Yes Myles Lipps, MD  montelukast (SINGULAIR) 10 MG tablet Take 1 tablet (10 mg total) by mouth at bedtime. 02/28/18  Yes Myles Lipps, MD  Multiple Vitamin (MULTIVITAMIN WITH MINERALS) TABS tablet Take 1 tablet by mouth daily.   Yes [provider]  Multiple Vitamins-Minerals (HAIR/SKIN/NAILS) TABS Take 1 tablet by mouth daily.   Yes [provider]    Past Medical History:  Diagnosis Date  . Allergy   . Anxiety     Past Surgical History:  Procedure Laterality Date  . ANTERIOR CRUCIATE LIGAMENT REPAIR Right    2010 and 2013    Social History   Tobacco Use  . Smoking status: Never Smoker  . Smokeless tobacco: Never Used  Substance Use Topics  . Alcohol use: Yes    Comment: occ    Family History  Problem Relation Age of Onset  . Cancer Mother   . Stroke Maternal Grandmother   . Stroke Paternal Grandmother   . Stroke Paternal Grandfather     ROS  Objective  Vitals as reported by the patient: none  There were no vitals filed for this visit.  ASSESSMENT and PLAN  1. Depression with anxiety Doing really well on current regime. ocd tendencies well controlled. Having mild side effects that are tolerable to  patient. Will continue current regime.  2. Seasonal allergies Not controlled. Adding flonase.  3. Drug-induced constipation Discussed increasing miralax to BID, also discussed mag oxide OTC if not responding well to higher dose of miralax.  Other orders - montelukast (SINGULAIR) 10 MG tablet; Take 1 tablet (10 mg total) by mouth at bedtime. - DULoxetine (CYMBALTA) 60 MG capsule; Take 1 capsule (60 mg total) by mouth daily. - fluticasone (FLONASE) 50 MCG/ACT nasal spray; Place 1 spray into both nostrils 2 (two) times daily.  FOLLOW-UP: 3 months   The above assessment and management plan was discussed with the patient. The patient  verbalized understanding of and has agreed to the management plan. Patient is aware to call the clinic if symptoms persist or worsen. Patient is aware when to return to the clinic for a follow-up visit. Patient educated on when it is appropriate to go to the emergency department.    I provided 12 minutes of non-face-to-face time during this encounter.  Myles Lipps, MD Primary Care at [facility] [address] [place], [place] [address] Ph.  [phone] Fax [phone]

## 2018-07-02 ENCOUNTER — Ambulatory Visit: Payer: BC Managed Care – PPO | Admitting: Family Medicine

## 2018-07-05 ENCOUNTER — Telehealth: Payer: Self-pay | Admitting: Family Medicine

## 2018-07-05 NOTE — Telephone Encounter (Signed)
LVM to reschedule appt from 07/02/2018 with [place]

## 2018-07-26 ENCOUNTER — Encounter: Payer: Self-pay | Admitting: Family Medicine

## 2018-07-26 ENCOUNTER — Ambulatory Visit: Payer: BC Managed Care – PPO | Admitting: Family Medicine

## 2018-07-26 ENCOUNTER — Other Ambulatory Visit: Payer: Self-pay

## 2018-07-26 VITALS — BP 113/73 | HR 68 | Temp 98.6°F | Resp 18 | Ht 65.0 in | Wt 160.2 lb

## 2018-07-26 DIAGNOSIS — F418 Other specified anxiety disorders: Secondary | ICD-10-CM | POA: Diagnosis not present

## 2018-07-26 DIAGNOSIS — J302 Other seasonal allergic rhinitis: Secondary | ICD-10-CM | POA: Diagnosis not present

## 2018-07-26 MED ORDER — MONTELUKAST SODIUM 10 MG PO TABS: 10.0000 mg | ORAL_TABLET | Freq: Every day | ORAL | 3 refills | Status: AC

## 2018-07-26 MED ORDER — DULOXETINE HCL 60 MG PO CPEP: 60.0000 mg | ORAL_CAPSULE | Freq: Every day | ORAL | 3 refills | Status: AC

## 2018-07-26 NOTE — Progress Notes (Signed)
7/23/20204:55 PM  Misty Jackson December 03, 1990, 28 y.o., female [id number]  Chief Complaint  Patient presents with  . Anxiety    follow up    HPI:   Patient is a 28 y.o. female with past medical history significant for depression, anxiety, seasonal allergies, who presents today for followup   Last OV March 2020 - telemedicine No changes  Doing really duloxetine Constipation was resolved with use of smooth move tea Continues to have vivid dreams which she is doing ok  Used to be on buspar Will be moving back to [place] within the next couple of weeks  Requesting refills of medications to carry her on until able to establish with new PCP  Depression screen [facility] 2/9 07/26/2018 03/28/2018 02/28/2018  Decreased Interest 1 0 1  Down, Depressed, Hopeless 1 0 1  PHQ - 2 Score 2 0 2  Altered sleeping 1 - 2  Tired, decreased energy 0 - 2  Change in appetite 0 - 2  Feeling bad or failure about yourself  1 - 0  Trouble concentrating 0 - 0  Moving slowly or fidgety/restless 0 - 0  Suicidal thoughts 0 - 0  PHQ-9 Score 4 - 8  Difficult doing work/chores Somewhat difficult - Somewhat difficult    Fall Risk  03/28/2018 02/28/2018 12/19/2017 11/10/2016  Falls in the past year? 0 0 1 No  Number falls in past yr: 0 0 0 -  Injury with Fall? 0 0 1 -  Follow up Falls evaluation completed - - -     No Known Allergies  Prior to Admission medications   Medication Sig Start Date End Date Taking? Authorizing Provider  DULoxetine (CYMBALTA) 60 MG capsule Take 1 capsule (60 mg total) by mouth daily. 03/28/18  Yes Rutherford Guys, MD  fluticasone (FLONASE) 50 MCG/ACT nasal spray Place 1 spray into both nostrils 2 (two) times daily. 03/28/18  Yes Rutherford Guys, MD  montelukast (SINGULAIR) 10 MG tablet Take 1 tablet (10 mg total) by mouth at bedtime. 03/28/18  Yes Rutherford Guys, MD  Multiple Vitamin (MULTIVITAMIN WITH MINERALS) TABS tablet Take 1 tablet by mouth daily.   Yes [provider]   Multiple Vitamins-Minerals (HAIR/SKIN/NAILS) TABS Take 1 tablet by mouth daily.   Yes [provider]    Past Medical History:  Diagnosis Date  . Allergy   . Anxiety     Past Surgical History:  Procedure Laterality Date  . ANTERIOR CRUCIATE LIGAMENT REPAIR Right    2010 and 2013    Social History   Tobacco Use  . Smoking status: Never Smoker  . Smokeless tobacco: Never Used  Substance Use Topics  . Alcohol use: Yes    Comment: occ    Family History  Problem Relation Age of Onset  . Cancer Mother   . Stroke Maternal Grandmother   . Stroke Paternal Grandmother   . Stroke Paternal Grandfather     ROS Per hpi  OBJECTIVE:  Today's Vitals   07/26/18 1637  BP: 113/73  Pulse: 68  Resp: 18  Temp: 98.6 F (37 C)  TempSrc: Oral  SpO2: 100%  Weight: 160 lb 3.2 oz (72.7 kg)  Height: 5\' 5"  (1.651 m)   Body mass index is 26.66 kg/m.   Physical Exam Vitals signs and nursing note reviewed.  Constitutional:      Appearance: She is well-developed.  HENT:     Head: Normocephalic and atraumatic.  Eyes:     General: No scleral icterus.  Conjunctiva/sclera: Conjunctivae normal.     Pupils: Pupils are equal, round, and reactive to light.  Neck:     Musculoskeletal: Neck supple.  Pulmonary:     Effort: Pulmonary effort is normal.  Skin:    General: Skin is warm and dry.  Neurological:     Mental Status: She is alert and oriented to person, place, and time.    ASSESSMENT and PLAN  1. Depression with anxiety Controlled. Continue current regime.   2. Seasonal allergies Controlled. Continue current regime.   Other orders - DULoxetine (CYMBALTA) 60 MG capsule; Take 1 capsule (60 mg total) by mouth daily. - montelukast (SINGULAIR) 10 MG tablet; Take 1 tablet (10 mg total) by mouth at bedtime.  No follow-ups on file.    Myles LippsIrma M Santiago, MD Primary Care at [facility] [address] [place], [place] [id number] Ph.  [phone] Fax [phone]
